# Patient Record
Sex: Female | Born: 2000 | Race: White | Hispanic: Yes | Marital: Single | Smoking: Never smoker
Health system: Southern US, Community
[De-identification: ages and names within clinical notes are randomized; demographics above are authoritative.]

---
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
## 2018-01-23 ENCOUNTER — Ambulatory Visit: Payer: Federal, State, Local not specified - PPO | Admitting: Family Medicine

## 2018-01-23 ENCOUNTER — Ambulatory Visit (INDEPENDENT_AMBULATORY_CARE_PROVIDER_SITE_OTHER): Payer: Federal, State, Local not specified - PPO | Admitting: Family Medicine

## 2018-01-23 ENCOUNTER — Encounter: Payer: Self-pay | Admitting: Family Medicine

## 2018-01-23 VITALS — BP 111/70 | HR 90 | Temp 98.5°F | Resp 14

## 2018-01-23 DIAGNOSIS — N390 Urinary tract infection, site not specified: Secondary | ICD-10-CM

## 2018-01-23 LAB — POCT URINE PREGNANCY: Preg Test, Ur: NEGATIVE

## 2018-01-23 MED ORDER — NITROFURANTOIN MONOHYD MACRO 100 MG PO CAPS: 100.0000 mg | ORAL_CAPSULE | Freq: Two times a day (BID) | ORAL | 0 refills | Status: DC

## 2018-01-23 NOTE — Progress Notes (Signed)
Patient presents today with symptoms of dysuria for the last 2 days. Patient states that she has also had some clear vaginal discharge. She denies any odor to it. She states her last menstrual period was a week ago. She has not been sexually active for months.she denies any significant abdominal pain or flank pain. She has not had any fevers chills or headaches.  ROS: Negative except mentioned above. Vitals as per Epic. GENERAL: NAD RESP: CTA B CARD: RRR ABD: positive bowel sounds, nontender, no rebound or guarding appreciated, no flank tenderness NEURO: CN II-XII grossly intact   Urine analysis: 1+ leukocytes, negative nitrites, 2+ protein, pH 7.0, negative blood, 1.015 specific gravity, negative ketones, negative glucose  Urine pregnancy: negative  A/P: UTI - symptoms suggestive of UTI, will send urine for culture, will treat with Macrobid, recommend that patient had pelvic exam if symptoms do not resolve, patient is to seek medical attention if she continues to have symptoms or worsens, encouraged safe sex, patient declines STD screen.

## 2018-01-25 LAB — URINE CULTURE

## 2018-06-21 ENCOUNTER — Encounter: Payer: Self-pay | Admitting: Family Medicine

## 2018-06-21 ENCOUNTER — Ambulatory Visit (INDEPENDENT_AMBULATORY_CARE_PROVIDER_SITE_OTHER): Payer: Federal, State, Local not specified - PPO | Admitting: Family Medicine

## 2018-06-21 VITALS — BP 115/58 | HR 73 | Temp 98.4°F | Resp 14

## 2018-06-21 DIAGNOSIS — L089 Local infection of the skin and subcutaneous tissue, unspecified: Secondary | ICD-10-CM

## 2018-06-21 MED ORDER — SULFAMETHOXAZOLE-TRIMETHOPRIM 800-160 MG PO TABS: 1.0000 | ORAL_TABLET | Freq: Two times a day (BID) | ORAL | 0 refills | Status: DC

## 2018-06-21 MED ORDER — MUPIROCIN 2 % EX OINT: 1.0000 "application " | TOPICAL_OINTMENT | Freq: Two times a day (BID) | CUTANEOUS | 0 refills | Status: DC

## 2018-06-21 NOTE — Progress Notes (Signed)
Patient presents today with rash on both lower extremities.  Patient states that the areas started off like pimples.  She has had the symptoms for about 4 days.  She denies a rash anywhere else.  She denies any itchiness of the rash.  There is no significant discharge from the areas.  A few of them have had whiteheads.  She went to an urgent care yesterday and was put on Keflex.  No culture or blood work was done.  She denies any history of MRSA in the past.  She denies any URI symptoms or fever.  Patient does play volleyball and the legs are exposed to the gym floor.  She does shave her legs and admits to changing out her razor regularly.  She denies any tick bites or being around any pets.  ROS: Negative except mentioned above. Vitals as per Epic GENERAL: NAD HEENT: no pharyngeal erythema, no exudate, no erythema of TMs, no cervical LAD RESP: CTA B CARD: RRR SKIN: Several small circular slightly raised erythematous vesicles on the lower legs, no significant pustules, no streaks, mild acne on face, no other lesions anywhere else NEURO: CN II-XII grossly intact   A/P: Skin Infection -recommend changing antibiotic to Bactrim DS to cover MRSA, will also prescribe Bactroban, patient should wear pants until lesions have started to improve and no new ones develop, encourage patient cleaning her equipment for volleyball more frequently, avoid being in the weight room and in the whirlpool in the training room until lesions have scabbed over or resolved, seek medical attention if symptoms persist or worsen as discussed, above was discussed with athletic trainer.

## 2018-07-03 ENCOUNTER — Ambulatory Visit (INDEPENDENT_AMBULATORY_CARE_PROVIDER_SITE_OTHER): Payer: Federal, State, Local not specified - PPO | Admitting: Family Medicine

## 2018-07-03 ENCOUNTER — Encounter: Payer: Self-pay | Admitting: Family Medicine

## 2018-07-03 VITALS — BP 107/63 | HR 74 | Temp 97.6°F | Resp 14

## 2018-07-03 DIAGNOSIS — R21 Rash and other nonspecific skin eruption: Secondary | ICD-10-CM

## 2018-07-03 NOTE — Progress Notes (Signed)
Patient presents today for follow-up regarding rash on bilateral lower extremities.  Patient states that the rash has resolved since taking the medication that was prescribed.  She denies any new lesions.  She has not shaved her legs recently.  ROS: Negative except mentioned above.  GENERAL: NAD RESP: CTA B CARD: RRR SKIN: Lower Extremities - there are a few scabbed areas on the lower extremities, no vesicular lesions noted NEURO: CN II-XII grossly intact   A/P: Skin rash -has resolved with antibiotic medication, discussed with patient not to use same razor for too long, avoid any scented lotions, return back to full activity, seek medical attention if further problems.

## 2018-07-30 ENCOUNTER — Ambulatory Visit: Payer: Federal, State, Local not specified - PPO | Admitting: Family Medicine

## 2018-07-31 ENCOUNTER — Ambulatory Visit: Payer: Federal, State, Local not specified - PPO | Admitting: Family Medicine

## 2019-05-10 ENCOUNTER — Other Ambulatory Visit: Payer: Self-pay

## 2019-05-10 ENCOUNTER — Ambulatory Visit: Payer: Federal, State, Local not specified - PPO | Admitting: Family Medicine

## 2019-05-10 ENCOUNTER — Encounter: Payer: Self-pay | Admitting: Family Medicine

## 2019-05-10 VITALS — BP 113/72 | HR 43 | Temp 98.6°F | Wt 152.6 lb

## 2019-05-10 DIAGNOSIS — R6889 Other general symptoms and signs: Secondary | ICD-10-CM

## 2019-05-21 ENCOUNTER — Other Ambulatory Visit (INDEPENDENT_AMBULATORY_CARE_PROVIDER_SITE_OTHER): Payer: Federal, State, Local not specified - PPO | Admitting: Family Medicine

## 2019-05-21 ENCOUNTER — Other Ambulatory Visit: Payer: Self-pay

## 2019-05-21 DIAGNOSIS — R6889 Other general symptoms and signs: Secondary | ICD-10-CM

## 2019-05-21 DIAGNOSIS — R634 Abnormal weight loss: Secondary | ICD-10-CM

## 2019-05-22 LAB — CBC WITH DIFFERENTIAL/PLATELET
Basophils Absolute: 0 10*3/uL (ref 0.0–0.2)
Basos: 0 %
EOS (ABSOLUTE): 0 10*3/uL (ref 0.0–0.4)
Eos: 0 %
Hematocrit: 38.7 % (ref 34.0–46.6)
Hemoglobin: 12.9 g/dL (ref 11.1–15.9)
Immature Grans (Abs): 0 10*3/uL (ref 0.0–0.1)
Immature Granulocytes: 0 %
Lymphocytes Absolute: 0.7 10*3/uL (ref 0.7–3.1)
Lymphs: 23 %
MCH: 32.9 pg (ref 26.6–33.0)
MCHC: 33.3 g/dL (ref 31.5–35.7)
MCV: 99 fL — ABNORMAL HIGH (ref 79–97)
Monocytes Absolute: 0.2 10*3/uL (ref 0.1–0.9)
Monocytes: 7 %
Neutrophils Absolute: 2.1 10*3/uL (ref 1.4–7.0)
Neutrophils: 70 %
Platelets: 214 10*3/uL (ref 150–450)
RBC: 3.92 x10E6/uL (ref 3.77–5.28)
RDW: 13.5 % (ref 11.7–15.4)
WBC: 3.1 10*3/uL — ABNORMAL LOW (ref 3.4–10.8)

## 2019-05-22 LAB — VITAMIN D 25 HYDROXY (VIT D DEFICIENCY, FRACTURES): Vit D, 25-Hydroxy: 48.1 ng/mL (ref 30.0–100.0)

## 2019-05-22 LAB — FERRITIN: Ferritin: 97 ng/mL (ref 15–150)

## 2019-05-22 LAB — SEDIMENTATION RATE: Sed Rate: 2 mm/hr (ref 0–32)

## 2019-05-22 LAB — TSH+FREE T4
Free T4: 0.89 ng/dL (ref 0.82–1.77)
TSH: 1.35 u[IU]/mL (ref 0.450–4.500)

## 2019-06-19 ENCOUNTER — Other Ambulatory Visit: Payer: Self-pay

## 2019-06-19 DIAGNOSIS — Z20822 Contact with and (suspected) exposure to covid-19: Secondary | ICD-10-CM

## 2019-06-21 LAB — NOVEL CORONAVIRUS, NAA: SARS-CoV-2, NAA: NOT DETECTED

## 2019-07-23 ENCOUNTER — Telehealth: Payer: Federal, State, Local not specified - PPO | Admitting: Family Medicine

## 2019-07-23 ENCOUNTER — Other Ambulatory Visit: Payer: Self-pay

## 2019-11-05 ENCOUNTER — Telehealth: Payer: Self-pay | Admitting: Family Medicine

## 2019-11-05 NOTE — Progress Notes (Signed)
Virtual Visit Agreed To By Patient (also patient requested parents to be on for part of the virtual visit)  Patient admits that she was hit in the face with the ball yesterday. She denies any concussive symptoms yesterday or today. SCAT5 was done by the athletic trainer and reviewed. Admits to normal sleep and was able to do schoolwork without any problems.  Athletic trainer voiced concerns over patients possible weight loss over the last few weeks. I brought this up to the patient who denies any weight loss recently and states she has anxiety when asked by the trainer to be weighed. The trainer asked her to weigh in yesterday. It has only happened this one time since January according to the patient. Patient admits that she did change her diet and lose significant weight intentionally last year but did work with a nutritionist and has tried to maintain a healthy diet and good weight over the last several months. She admits that she went home and weighed herself on her scale yesterday and she was 147lbs. She states that she took a picture of this and sent it to her father. She also admits that her roommate from last year made her feel bad about being vegan. That has made her feel conscious about what she eats. She became tearful when speaking about this. She admits that she has spoken to a previous high school coach about her feelings related to this situation and it has helped. She states that her roommate is no longer an issue since she graduated last year. Patient denies restricting, binging, or purging. She denies using any medications for weight loss. She denies having disordered eating or body image issues. She states she feels fine.   A/P: Head Injury- seems to have done fine on SCAT5 and denies any symptoms. Can continue athletic activity. Encouraged patient to inform athletic trainer and/or seek other medical attention if any symptoms/ problems arise. Patient agreed to do so.   Concerns regarding  weight by AT- Discussed with parents and patient that given the concern brought up by the athletic trainer I felt a need to investigate further, last year patient denied having any disordered eating or body image issues and said that she intentionally wanted to lose weight and changed her diet and exercise routine over last spring/summer. A nutritionist from home apparently has helped her with maintaining a well balanced diet and weight. I asked if Deborah Quinn would allow me to speak to the nutritionist so I could discuss her progress, etc. Deborah Quinn is reluctant for some reason at this time to do this. 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 Deborah Quinn said she would get back to me once she spoke to her parents in private.  I told Deborah Quinn and her parents that I would speak to the athletic trainer about not weighing her as this brings about anxiety to Deborah Quinn. Deborah Quinn however said she would have no problem coming to my office to check in on things periodically and be weighed. I discussed the importance of knowing BMI and the possible health issues that could arise with low BMI.   She stated she has a busy week this week but will make an appointment to come in and see me next week. I also  advised her to establish care with a professional therapist/counselor at the counseling center to talk through some of the issues discussed above. The patient stated she would think about it but her schedule is so busy with school and athletics.  I informed the patient and her parents that I was leaving my position at [facility] in a few weeks but that I would like to see Deborah Quinn next week and that we would discuss follow-up periodically with the provider taking my place. Patient and parents were appreciative.

## 2019-11-14 ENCOUNTER — Other Ambulatory Visit: Payer: Self-pay | Admitting: Family Medicine

## 2019-11-14 ENCOUNTER — Encounter (INDEPENDENT_AMBULATORY_CARE_PROVIDER_SITE_OTHER): Payer: Self-pay

## 2019-11-14 ENCOUNTER — Ambulatory Visit (INDEPENDENT_AMBULATORY_CARE_PROVIDER_SITE_OTHER): Payer: Federal, State, Local not specified - PPO | Admitting: Family Medicine

## 2019-11-14 ENCOUNTER — Other Ambulatory Visit: Payer: Self-pay

## 2019-11-14 VITALS — BP 109/61 | HR 67 | Temp 98.4°F | Resp 14 | Wt 143.0 lb

## 2019-11-14 DIAGNOSIS — Z3009 Encounter for other general counseling and advice on contraception: Secondary | ICD-10-CM

## 2019-11-14 DIAGNOSIS — Z87898 Personal history of other specified conditions: Secondary | ICD-10-CM

## 2019-11-14 DIAGNOSIS — R634 Abnormal weight loss: Secondary | ICD-10-CM

## 2019-11-14 NOTE — Progress Notes (Signed)
Patient presents for follow-up after our conversation with her parents. The patient is here to get an actual weight since the patient voiced concerns about being weighed by her trainer. He is concerned about her weight loss over the past year but denies singling her out to weight in as the patient admits to. When she started at [facility] 2.5 years ago she weighed 180 lbs and today she weighs 143 lbs (sweater and sweat pants with no shoes). The patient admits to intentional weight loss by going vegan and starting a new exercise program over last spring/summer. She denies having a disordered eating problem. She initially felt cold after the weight loss and noticed some hair loss as well but states that has resolved now. She admits that she has been in communication with a nutritionist back home that is helping her with her diet. When asked about her typical daily meals she said she skips breakfast and has a salad for lunch and a salad for dinner as well. She includes tofu and beans sometimes. She usually drinks almond milk and water. She denies any binging/purging. Patient had some labs drawn last fall which showed normal thyroid levels. Patient had a bone density test last year which did not show osteopenia. Patient has menstrual cycles each month but only last two days and are light. She would like a referral to OB/GYN because she has an interest in birth control. She is in a new relationship and would like to discuss options.   ROS: Negative except mentioned above. Vitals as per Epic. 183 lbs, patient reported being 6 ft 1.5 inches, BMI (18.6) GENERAL: NAD No time for physical exam because patient said she had to leave for another appointment/class.   A/P: Weight Loss - patient has had significant weight loss but states she is trying to work with a nutritionist she trusts to get to and maintain a healthy weight. I explained to her that being underweight can cause major health problems and could make her more  susceptible to injury. Also explained that she likely has lost muscle mass as well that could prevent her from volleyball peak performance. She agrees to let me speak to her nutritionist next week if she is also allowed to be on the call.  I told her I had no problems with this and to set up the meeting and to let me know. I also encouraged her to talk to the counseling center for any struggles she may have with body image. She denies having any current issues with this now since her roommate from last year has graduated. Her roommate caused significant anxiety for the patient because she often made negative comments about being vegan. As requested, will put in referral for OB/GYN to discuss birth control options. Would recommend hormone levels be checked as well by OB/GYN given weight loss and changes in menstrual flow.  Would recommend patient follow-up with provider taking my position in 2-3 weeks to establish care. I will inform the provider about this patient before I leave [place]. Would recommend further evaluation by [facility] (in [place]) if patient continues to lose weight or if there are other concerns.

## 2019-11-15 NOTE — Progress Notes (Signed)
Patient presents to discuss her feeling of being cold. She noticed this over the summer. She denies any discoloration of her toes or fingers. She admits to generally feeling cold at times. She admits to a change in diet over the spring/summer. She became vegan and started a work-out program with her parent. She admits to significant weight loss. She denies binging or purging. She denies amenorrhea. She denies any history of disordered eating in the past. She admits to having COVID-19 over the spring as well. She did have labs drawn at home which she states were normal. She denies any body image issues.  ROS: Negative except mentioned above. Vitals as per Epic. GENERAL: NAD RESP: CTA B CARD: RRR NEURO: CN II-XII grossly intact   A/P: Weight Loss (intentional), Feeling Cold - Discussed with the patient the medical risks with being underweight especially as an athlete. Recommend working with nutritionist. Recommend taking daily MVI.  She denies having any problems with disordered eating or body image. Will do some basic labwork including thyroid tests. Would recommend bone density test as well. Will review results and let patient know if there are any major concerns. Will discuss with trainer. Explained she should use the counseling service as a resource if she is struggling with any body image issues, depression, or anxiety. Patient denies having problems.

## 2019-11-21 ENCOUNTER — Telehealth: Payer: Self-pay | Admitting: Family Medicine

## 2019-11-21 NOTE — Telephone Encounter (Signed)
Virtual Telephone Call with Patient and Nutritionist  Telephone conversation was about Deborah Quinn's nutritional status. The nutritionist from [facility] in [place] last saw Deborah Quinn back in 08/2019 and had a virtual visit with her 09/2019. She states that Deborah Quinn has gained weight since 08/2019 when she was 127 lbs. She was 143 lbs last week in my office. The nutritionist believes Deborah Quinn has made strides in improving her diet from what Lollie has shared with her. She does believe Deborah Quinn still needs to add more protein to her diet. A MVI daily has also been recommended that Deborah Quinn admits to taking. On the call today, Deborah Quinn also admits to having a normal flow and and number of days for her menstrual cycle this month. Up until now she admitted to monthly cycles but being light for two days. Deborah Quinn admits to being in good spirits and not needing any psychological help.   From the conversation today it looks like Deborah Quinn has improved since 08/2019 in regards to her diet and weight. Recommend that provider taking over for me Deborah Quinn) continue to monitor her to make sure things are going in the right direction. Informed Deborah Quinn that the provider may contact her to meet her in the coming weeks. Deborah Quinn can always reach out to her Deborah Quinn the health center sooner if needed. If there are further concerns a referral to Deborah Quinn) is an option.   Above was shared with Deborah Quinn and will be shared with provider taking over for me Deborah Quinn).

## 2019-11-26 ENCOUNTER — Other Ambulatory Visit: Payer: Self-pay

## 2019-11-26 ENCOUNTER — Ambulatory Visit (INDEPENDENT_AMBULATORY_CARE_PROVIDER_SITE_OTHER): Payer: Federal, State, Local not specified - PPO | Admitting: Obstetrics and Gynecology

## 2019-11-26 ENCOUNTER — Encounter: Payer: Self-pay | Admitting: Obstetrics and Gynecology

## 2019-11-26 ENCOUNTER — Other Ambulatory Visit (HOSPITAL_COMMUNITY)
Admission: RE | Admit: 2019-11-26 | Discharge: 2019-11-26 | Disposition: A | Discharge status: Home / Self Care | Payer: Federal, State, Local not specified - PPO | Source: Ambulatory Visit | Attending: Obstetrics and Gynecology | Admitting: Obstetrics and Gynecology

## 2019-11-26 VITALS — BP 99/64 | HR 52 | Ht 73.0 in | Wt 145.5 lb

## 2019-11-26 DIAGNOSIS — Z01419 Encounter for gynecological examination (general) (routine) without abnormal findings: Secondary | ICD-10-CM

## 2019-11-26 DIAGNOSIS — Z124 Encounter for screening for malignant neoplasm of cervix: Secondary | ICD-10-CM | POA: Insufficient documentation

## 2019-11-26 DIAGNOSIS — Z3009 Encounter for other general counseling and advice on contraception: Secondary | ICD-10-CM

## 2019-11-26 NOTE — Addendum Note (Signed)
Addended by: Dorian Pod on: 11/26/2019 08:24 AM   Modules accepted: Orders

## 2019-11-26 NOTE — Progress Notes (Signed)
HPI:      [name] is a 19 y.o. No obstetric history on file. who LMP was Patient's last menstrual period was 11/10/2019.  Subjective:   She presents today for her annual examination.  She is considering cycle control/birth control methods.  She reports normal regular menses.  But gives a history of her cycle being somewhat irregular during stress last year.  She also had unintended weight loss at that time but reports that this has now been resolved because she has changed her living situation at college.    Hx: The following portions of the patient's history were reviewed and updated as appropriate:             She  has no past medical history on file. She does not have a problem list on file. She  has no past surgical history on file. Her family history includes Diabetes in her maternal grandfather and paternal grandfather; Heart disease in her maternal grandfather; Hypertension in her maternal grandfather. She  reports that she has never smoked. She has never used smokeless tobacco. She reports that she does not drink alcohol or use drugs. She currently has no medications in their medication list. She has No Known Allergies.       Review of Systems:  Review of Systems  Constitutional: Denied constitutional symptoms, night sweats, recent illness, fatigue, fever, insomnia and weight loss.  Eyes: Denied eye symptoms, eye pain, photophobia, vision change and visual disturbance.  Ears/Nose/Throat/Neck: Denied ear, nose, throat or neck symptoms, hearing loss, nasal discharge, sinus congestion and sore throat.  Cardiovascular: Denied cardiovascular symptoms, arrhythmia, chest pain/pressure, edema, exercise intolerance, orthopnea and palpitations.  Respiratory: Denied pulmonary symptoms, asthma, pleuritic pain, productive sputum, cough, dyspnea and wheezing.  Gastrointestinal: Denied, gastro-esophageal reflux, melena, nausea and vomiting.  Genitourinary: Denied genitourinary symptoms  including symptomatic vaginal discharge, pelvic relaxation issues, and urinary complaints.  Musculoskeletal: Denied musculoskeletal symptoms, stiffness, swelling, muscle weakness and myalgia.  Dermatologic: Denied dermatology symptoms, rash and scar.  Neurologic: Denied neurology symptoms, dizziness, headache, neck pain and syncope.  Psychiatric: Denied psychiatric symptoms, anxiety and depression.  Endocrine: Denied endocrine symptoms including hot flashes and night sweats.   Meds:   No current outpatient medications on file prior to visit.   No current facility-administered medications on file prior to visit.    Objective:     Vitals:   11/26/19 0741  BP: 99/64  Pulse: (!) 52              Physical examination General NAD, Conversant  HEENT Atraumatic; Op clear with mmm.  Normo-cephalic. Pupils reactive. Anicteric sclerae  Thyroid/Neck Smooth without nodularity or enlargement. Normal ROM.  Neck Supple.  Skin No rashes, lesions or ulceration. Normal palpated skin turgor. No nodularity.  Breasts: No masses or discharge.  Symmetric.  No axillary adenopathy.  Lungs: Clear to auscultation.No rales or wheezes. Normal Respiratory effort, no retractions.  Heart: NSR.  No murmurs or rubs appreciated. No periferal edema  Abdomen: Soft.  Non-tender.  No masses.  No HSM. No hernia  Extremities: Moves all appropriately.  Normal ROM for age. No lymphadenopathy.  Neuro: Oriented to PPT.  Normal mood. Normal affect.     Pelvic:   Vulva: Normal appearance.  No lesions.  Vagina: No lesions or abnormalities noted.  Support: Normal pelvic support.  Urethra No masses tenderness or scarring.  Meatus Normal size without lesions or prolapse.  Cervix: Normal appearance.  No lesions.  Anus: Normal exam.  No lesions.  Perineum: Normal exam.  No lesions.        Bimanual   Uterus: Normal size.  Non-tender.  Mobile.  AV.  Adnexae: No masses.  Non-tender to palpation.  Cul-de-sac: Negative for  abnormality.      Assessment:    No obstetric history on file. There are no problems to display for this patient.    1. Well woman exam with routine gynecological exam   2. Birth control counseling        Plan:            1.  Basic Screening Recommendations The basic screening recommendations for asymptomatic women were discussed with the patient during her visit.  The age-appropriate recommendations were discussed with her and the rational for the tests reviewed.  When I am informed by the patient that another primary care physician has previously obtained the age-appropriate tests and they are up-to-date, only outstanding tests are ordered and referrals given as necessary.  Abnormal results of tests will be discussed with her when all of her results are completed.  Routine preventative health maintenance measures emphasized: Exercise/Diet/Weight control, Tobacco Warnings, Alcohol/Substance use risks and Stress Management Pap GC/CT performed as per AE  2.  Birth Control I discussed multiple birth control options and methods with the patient.  The risks and benefits of each were reviewed.  She says that birth control is not currently necessary and she has not yet made up her mind regarding cycle control/birth control methods.  Orders No orders of the defined types were placed in this encounter.   No orders of the defined types were placed in this encounter.       F/U  No follow-ups on file.  Finis Bud, M.D. 11/26/2019 8:13 AM

## 2019-11-28 LAB — CYTOLOGY - PAP
Chlamydia: NEGATIVE
Comment: NEGATIVE
Comment: NORMAL
Diagnosis: NEGATIVE
Diagnosis: REACTIVE
Neisseria Gonorrhea: NEGATIVE

## 2019-12-02 ENCOUNTER — Other Ambulatory Visit: Payer: Self-pay | Admitting: Family

## 2019-12-02 ENCOUNTER — Other Ambulatory Visit: Payer: Federal, State, Local not specified - PPO | Admitting: Family

## 2019-12-02 ENCOUNTER — Other Ambulatory Visit: Payer: Self-pay

## 2019-12-02 ENCOUNTER — Telehealth: Payer: Self-pay | Admitting: Family

## 2019-12-02 DIAGNOSIS — R634 Abnormal weight loss: Secondary | ICD-10-CM

## 2019-12-02 LAB — POCT URINALYSIS DIPSTICK
Bilirubin, UA: NEGATIVE
Blood, UA: NEGATIVE
Glucose, UA: NEGATIVE
Ketones, UA: NEGATIVE
Leukocytes, UA: NEGATIVE
Nitrite, UA: NEGATIVE
Protein, UA: NEGATIVE
Spec Grav, UA: 1.01 (ref 1.010–1.025)
Urobilinogen, UA: 0.2 E.U./dL
pH, UA: 7.5 (ref 5.0–8.0)

## 2019-12-02 NOTE — Progress Notes (Signed)
See telephone note with vitals.

## 2019-12-02 NOTE — Telephone Encounter (Signed)
1pm Webex scheduled to discuss carry over plan from Dr. Allena Katz.  Pt requests that information regarding this ov NOT be shared with Deborah Quinn, Event organiser.  Pt states that she is "doing well."  She doesn't feel that she has an eating disorder but instead, "disordered eating."  She has a nutritionist at home in [facility].  She meets with her nutritionist monthly virtually.  Pt is not scheduled to meet with her again until April.  Pt states that a lot of her issues were related to roommate issues in the Fall.  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  She states that her weight is improving.    Vitals: Weight-137.2 Orthostatics-    Lying- 102/65, 46   Sitting- 111/73, 62   Standing- 111/69, 86  Discussed option of an off site counselor. Someone that would offer "private" counseling as she continues to work through her "disordered eating" and emotions with food.  Pt agrees to review and consider [facility].  Pt will f/u in person next week.  Pt also states that she has a resolving "spot" on her right shin that is healing well.  At first she was concerned for repeat of staph infection she had in the fall, but now that it is healing, she thinks its "ok."  Pt will f/u next week for weight check and counseling discussion.  Will eval "spot" on leg at that time. Pt is aware that she should f/u sooner if area on leg spreads or gets worse.   Unable to chart normal note as Epic schedule is not in the system at this time.  U/A documented in orders.

## 2019-12-05 ENCOUNTER — Ambulatory Visit: Payer: Federal, State, Local not specified - PPO | Admitting: Family

## 2019-12-05 ENCOUNTER — Other Ambulatory Visit: Payer: Self-pay

## 2019-12-05 ENCOUNTER — Encounter: Payer: Self-pay | Admitting: Family

## 2019-12-05 DIAGNOSIS — R21 Rash and other nonspecific skin eruption: Secondary | ICD-10-CM

## 2019-12-05 NOTE — Progress Notes (Signed)
   Acute Office Visit  Subjective:    Patient ID: Deborah Quinn, female    DOB: [birth year], 19 y.o.   MRN: [id number]  Chief Complaint  Patient presents with  . Rash    HPI Patient is in today with c/o rash to bilateral knees. Pt has a hx of staph infection.  She noticed several "spots" on her knees 2 days ago.  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  She sprays a disinfectant on them frequently.  The area wasn't itchy.  The "spots" have now disappeared.   No Known Allergies  Review of Systems  Skin: Positive for rash.       Objective:    Physical Exam Constitutional:      Appearance: Normal appearance.  Skin:    General: Skin is warm and dry.     Comments: No visible rash or abnormality to legs.        Assessment & Plan:   Problem List Items Addressed This Visit    None    Visit Diagnoses    Rash and nonspecific skin eruption    -  Primary     Reviewed exam findings. Per pts history, she will trial weekly wash to legs with otc Hibiclens.  Decrease disinfectant spray as that be causing irritation.  Rtc with any regressing sxs.  No orders of the defined types were placed in this encounter.    Hani Campusano, Deirdre Peer, NP

## 2019-12-09 ENCOUNTER — Encounter: Payer: Self-pay | Admitting: Family

## 2019-12-09 ENCOUNTER — Other Ambulatory Visit: Payer: Self-pay

## 2019-12-09 ENCOUNTER — Ambulatory Visit: Payer: Federal, State, Local not specified - PPO | Admitting: Family

## 2019-12-09 DIAGNOSIS — Z3009 Encounter for other general counseling and advice on contraception: Secondary | ICD-10-CM

## 2019-12-09 DIAGNOSIS — Z1152 Encounter for screening for COVID-19: Secondary | ICD-10-CM

## 2019-12-09 NOTE — Progress Notes (Signed)
   Established Patient Office Visit  Subjective:  Patient ID: Deborah Quinn, female    DOB: [birth year]  Age: 19 y.o. MRN: [id number]  CC: Follow up  HPI Deborah Quinn presents for follow up from last two previous ov. 1- "rash" is gone, she has purchased otc Hibclens and will cont once weekly use.   2- pt is concerned about being referred to [facility] because she doesn't want a diagnosis of an eating disorder, but she does want a therapist to work through her emotional health issues with 3- She has a persistent runny nose, no history of allergies.  4- Pt asks if there is anything she can do to help promote hair growth. She lost a lot of hair in the fall when she lost weight and was stressed. 5- Pt is unsure if she wants to obtain Covid 19 vaccine and requests antibody testing to see if she was exposed to her father.  History reviewed. No pertinent past medical history.  History reviewed. No pertinent surgical history.  No Known Allergies  ROS Review of Systems  HENT: Positive for rhinorrhea.   Skin: Negative.       Objective:    Physical Exam  Constitutional: She is cooperative.  HENT:  TMs clear bilaterally. Nasal mucosa inflamed with clear drainage.  Neurological: She is alert.    LMP 11/10/2019  Wt Readings from Last 3 Encounters:  11/26/19 145 lb 8 oz (66 kg)  11/14/19 143 lb (64.9 kg)  05/10/19 152 lb 9.6 oz (69.2 kg)      Assessment & Plan:   Problem List Items Addressed This Visit    None    Visit Diagnoses    Encounter for screening for COVID-19    -  Primary   Relevant Orders   SAR CoV2 Serology (COVID 19)AB(IGG)IA   Encounter for other general counseling or advice on contraception         1- Cont Hibiclens once weekly, monitor for changes 2- Pt agrees to contact [facility] for new pt appt, she understands that they are willing and able to see her without an Eating Disorder diagnosis.  Explained the importance of getting  established now, to give her a resource into next year. 3- Otc Claritin daily to help with runny nose and rash. 4- Otc Biotic daily to help with hair growth 5- Obtained Covid Ab IgG to assess for previous exposure to Covid 19.  Results will be emailed to pt.  Pt gives permission for this info to be shared via email.   Follow-up: Return in 3 weeks (on 12/30/2019) for weight check . Rtc sooner with any new or persistent concerns.   Deborah Quinn, Deirdre Peer, NP

## 2019-12-11 LAB — SAR COV2 SEROLOGY (COVID19)AB(IGG),IA: DiaSorin SARS-CoV-2 Ab, IgG: POSITIVE

## 2019-12-14 IMAGING — CR DG TOE GREAT 2+V*L*
1 series · 3 of 3 positions shown · non-contrast
Comparison: None.

CLINICAL DATA: 17-year-old female with history of bunion on the
left great toe 2 weeks ago.

EXAM:
LEFT GREAT TOE

[Series 1: dg toe great left · 0.14mm/px · 3 of 3 slices shown]
[im 1/3]
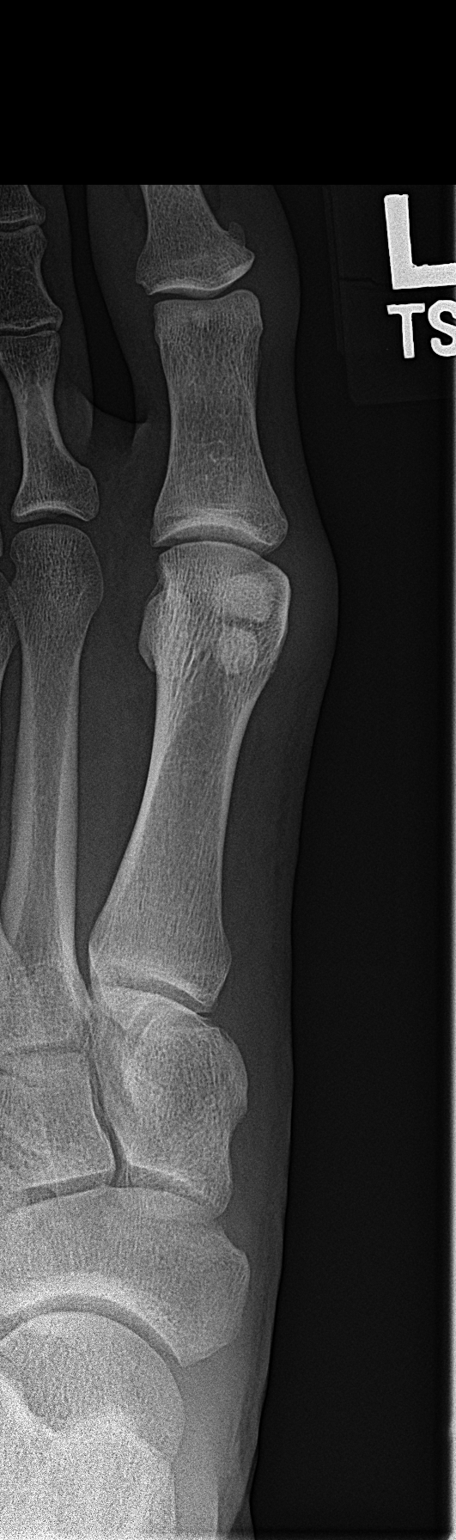
[im 2/3]
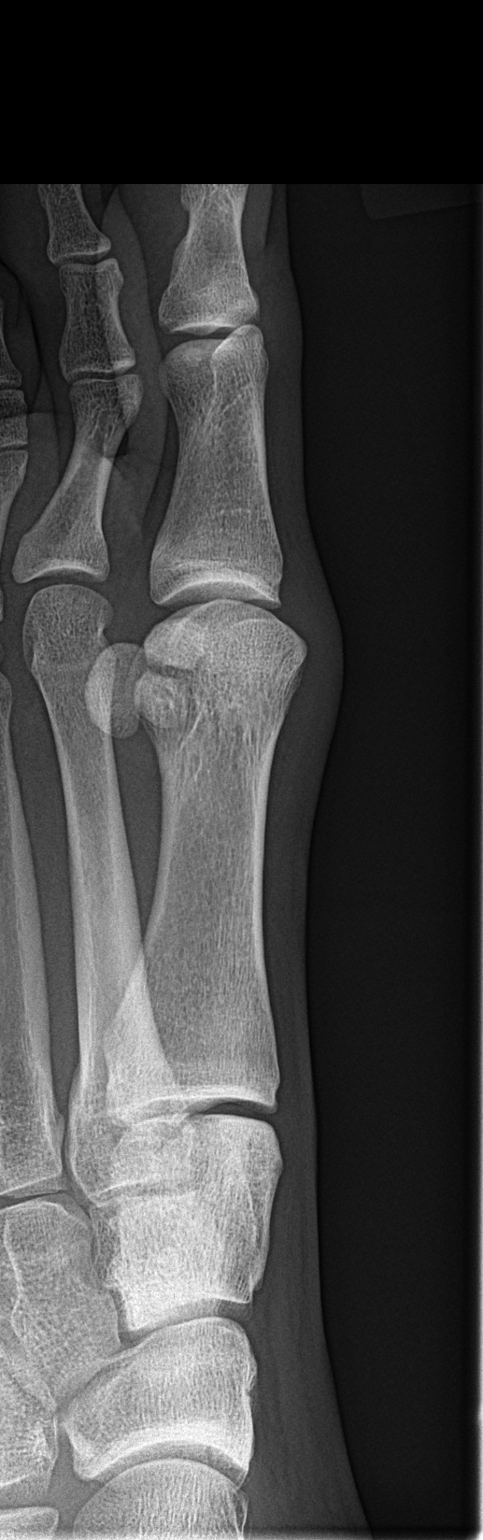
[im 3/3]
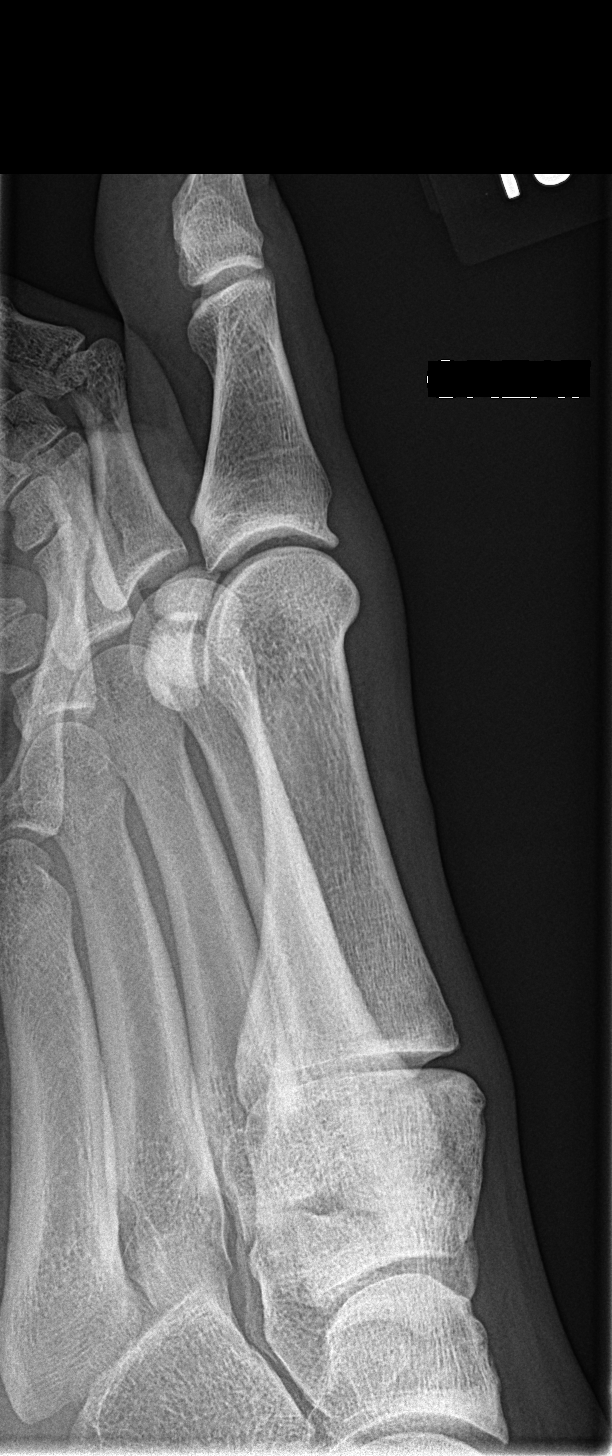

[3 of 3 positions shown; findings below may reference images not displayed]

FINDINGS: There is no evidence of fracture or dislocation. There is no
evidence of arthropathy or other focal bone abnormality. Soft
tissues are unremarkable.
IMPRESSION: Negative.

## 2019-12-30 ENCOUNTER — Ambulatory Visit: Payer: Federal, State, Local not specified - PPO | Admitting: Family

## 2019-12-30 ENCOUNTER — Other Ambulatory Visit: Payer: Self-pay

## 2019-12-30 DIAGNOSIS — L089 Local infection of the skin and subcutaneous tissue, unspecified: Secondary | ICD-10-CM

## 2019-12-30 MED ORDER — CEPHALEXIN 500 MG PO CAPS: 500.0000 mg | ORAL_CAPSULE | Freq: Four times a day (QID) | ORAL | 0 refills | Status: AC

## 2019-12-30 NOTE — Progress Notes (Signed)
   Acute Office Visit  Subjective:    Patient ID: Deborah Quinn, female    DOB: 2001-05-07, 19 y.o.   MRN: [id number]  Chief Complaint  Patient presents with  . Rash    HPI Patient is in today with c/o left eye infection.  4 days ago the area under her left eyelid became slightly swollen and tender.  It is getting worse.  No otc tx.  No known injury or insect bite.   No Known Allergies  Review of Systems  Eyes: Positive for pain and redness.       Pain under eyelid, not the eye itself       Objective:    Physical Exam Constitutional:      Appearance: Normal appearance.  Eyes:     Comments: Moderate swelling and erythema to left lower eye lid along left lower orbit.  Slightly ulcerated flat lesion noted centrally to erythema.  Tender to palpation.  EOMs intact.  Conjunctiva clear.  Neurological:     Mental Status: She is alert.       Assessment & Plan:   Problem List Items Addressed This Visit    None    Visit Diagnoses    Skin infection    -  Primary   Relevant Medications   cephALEXin (KEFLEX) 500 MG capsule     Reviewed exam findings.  Concern for an underlying cellulitis around orbit.  Covered with Keflex qid.  Keep area clean and dry.  Follow up if no improvement in 2 days.    Meds ordered this encounter  Medications  . cephALEXin (KEFLEX) 500 MG capsule    Sig: Take 1 capsule (500 mg total) by mouth 4 (four) times daily.    Dispense:  40 capsule    Refill:  0    Order Specific Question:   Supervising Provider    Answer:   Noralee Stain [[id number]]     Ayris Carano, Deirdre Peer, NP

## 2020-01-27 ENCOUNTER — Ambulatory Visit: Payer: Federal, State, Local not specified - PPO | Admitting: Family

## 2020-01-27 VITALS — Ht 74.0 in | Wt 146.6 lb

## 2020-01-27 NOTE — Progress Notes (Signed)
   Acute Office Visit  Subjective:    Patient ID: Deborah Quinn, female    DOB: April 09, 2001, 19 y.o.   MRN: [id number]  No chief complaint on file.   HPI Patient is in today for weight check and summer plan discussion. Pt states that she has been doing well.  She plans to leave campus 5 days and travel home to [facility].  She plans to see her nutritionist as needed while home, although she feels healthy and that her weight is "stable." Pt is still concerned about delayed hair growth.  She lost a lot of hair in the fall and it is slow to grow back.  She is taking otc Biotin.  No past medical history on file.  No past surgical history on file.   No Known Allergies  Review of Systems  Constitutional: Negative for activity change, appetite change and fatigue.       Objective:    Physical Exam Constitutional:      Appearance: Normal appearance.  Neurological:     Mental Status: She is alert.     Ht 6\' 2"  (1.88 m)   Wt 146 lb 9.6 oz (66.5 kg)   BMI 18.82 kg/m  Wt Readings from Last 3 Encounters:  01/27/20 146 lb 9.6 oz (66.5 kg)  11/26/19 145 lb 8 oz (66 kg)  11/14/19 143 lb (64.9 kg)       Assessment & Plan:   Problem List Items Addressed This Visit    None    Visit Diagnoses    Encounter for screening for nutritional disorder    -  Primary    Reviewed stable weight.  Pt will cont with weight maintenance and discussed room for weight gain.  She will see her nutritionist over the summer.  Pt will follow up with me in [employer] [facility] in the fall and we will re-introduce her to the new athletics physician as needed.  Athletic Trainer, 07-08-1978 notified of plan.  Pt is on waiting list for [facility] at this time as well.     No orders of the defined types were placed in this encounter.    WOOD, Teacher, early years/pre, NP
